# Patient Record
Sex: Female | Born: 1970 | Race: White | Hispanic: No | Marital: Married | Smoking: Never smoker
Health system: Southern US, Community
[De-identification: ages and names within clinical notes are randomized; demographics above are authoritative.]

## PROBLEM LIST (undated history)

## (undated) DIAGNOSIS — N920 Excessive and frequent menstruation with regular cycle: Secondary | ICD-10-CM

## (undated) DIAGNOSIS — E079 Disorder of thyroid, unspecified: Secondary | ICD-10-CM

## (undated) HISTORY — PX: OTHER SURGICAL HISTORY: SHX169

## (undated) HISTORY — DX: Disorder of thyroid, unspecified: E07.9

---

## 2005-06-10 ENCOUNTER — Other Ambulatory Visit: Admission: RE | Admit: 2005-06-10 | Discharge: 2005-06-10 | Payer: Self-pay | Admitting: Gynecology

## 2005-10-30 ENCOUNTER — Encounter: Admission: RE | Admit: 2005-10-30 | Discharge: 2005-10-30 | Payer: Self-pay | Admitting: Gynecology

## 2006-08-06 ENCOUNTER — Other Ambulatory Visit: Admission: RE | Admit: 2006-08-06 | Discharge: 2006-08-06 | Payer: Self-pay | Admitting: Gynecology

## 2008-09-01 ENCOUNTER — Encounter: Admission: RE | Admit: 2008-09-01 | Discharge: 2008-09-01 | Payer: Self-pay | Admitting: Gynecology

## 2009-09-19 ENCOUNTER — Encounter: Admission: RE | Admit: 2009-09-19 | Discharge: 2009-09-19 | Payer: Self-pay | Admitting: Gynecology

## 2010-09-17 ENCOUNTER — Other Ambulatory Visit: Payer: Self-pay | Admitting: Gynecology

## 2010-09-17 DIAGNOSIS — Z1231 Encounter for screening mammogram for malignant neoplasm of breast: Secondary | ICD-10-CM

## 2010-09-25 ENCOUNTER — Ambulatory Visit: Payer: Self-pay

## 2010-10-04 ENCOUNTER — Ambulatory Visit
Admission: RE | Admit: 2010-10-04 | Discharge: 2010-10-04 | Disposition: A | Discharge status: Home / Self Care | Payer: BC Managed Care – PPO | Source: Ambulatory Visit | Attending: Gynecology | Admitting: Gynecology

## 2010-10-04 DIAGNOSIS — Z1231 Encounter for screening mammogram for malignant neoplasm of breast: Secondary | ICD-10-CM

## 2011-09-26 ENCOUNTER — Other Ambulatory Visit: Payer: Self-pay | Admitting: Gynecology

## 2011-09-26 DIAGNOSIS — Z1231 Encounter for screening mammogram for malignant neoplasm of breast: Secondary | ICD-10-CM

## 2011-10-07 ENCOUNTER — Ambulatory Visit
Admission: RE | Admit: 2011-10-07 | Discharge: 2011-10-07 | Disposition: A | Discharge status: Home / Self Care | Payer: BC Managed Care – PPO | Source: Ambulatory Visit | Attending: Gynecology | Admitting: Gynecology

## 2011-10-07 DIAGNOSIS — Z1231 Encounter for screening mammogram for malignant neoplasm of breast: Secondary | ICD-10-CM

## 2011-12-13 ENCOUNTER — Encounter (HOSPITAL_BASED_OUTPATIENT_CLINIC_OR_DEPARTMENT_OTHER): Payer: Self-pay | Admitting: *Deleted

## 2011-12-13 NOTE — Progress Notes (Addendum)
NPO AFTER MN. ARRIVES AT 0615. NEEDS HG AND URINE PREG.  PT CASE HAS BEEN MOVED UP TO 6213YQ. OFFICE TO INFORM PT TO ARRIVE AT 0600.

## 2011-12-20 ENCOUNTER — Encounter (HOSPITAL_BASED_OUTPATIENT_CLINIC_OR_DEPARTMENT_OTHER)
Admission: RE | Disposition: A | Discharge status: Home / Self Care | Payer: Self-pay | Source: Ambulatory Visit | Attending: Gynecology

## 2011-12-20 ENCOUNTER — Encounter (HOSPITAL_BASED_OUTPATIENT_CLINIC_OR_DEPARTMENT_OTHER): Payer: Self-pay | Admitting: Anesthesiology

## 2011-12-20 ENCOUNTER — Encounter (HOSPITAL_BASED_OUTPATIENT_CLINIC_OR_DEPARTMENT_OTHER): Payer: Self-pay | Admitting: *Deleted

## 2011-12-20 ENCOUNTER — Ambulatory Visit (HOSPITAL_BASED_OUTPATIENT_CLINIC_OR_DEPARTMENT_OTHER)
Admission: RE | Admit: 2011-12-20 | Discharge: 2011-12-20 | Disposition: A | Discharge status: Home / Self Care | Payer: BC Managed Care – PPO | Source: Ambulatory Visit | Attending: Gynecology | Admitting: Gynecology

## 2011-12-20 ENCOUNTER — Ambulatory Visit (HOSPITAL_BASED_OUTPATIENT_CLINIC_OR_DEPARTMENT_OTHER): Payer: BC Managed Care – PPO | Admitting: Anesthesiology

## 2011-12-20 DIAGNOSIS — N920 Excessive and frequent menstruation with regular cycle: Secondary | ICD-10-CM | POA: Insufficient documentation

## 2011-12-20 DIAGNOSIS — R9389 Abnormal findings on diagnostic imaging of other specified body structures: Secondary | ICD-10-CM | POA: Insufficient documentation

## 2011-12-20 DIAGNOSIS — N840 Polyp of corpus uteri: Secondary | ICD-10-CM | POA: Insufficient documentation

## 2011-12-20 HISTORY — DX: Excessive and frequent menstruation with regular cycle: N92.0

## 2011-12-20 LAB — POCT PREGNANCY, URINE: Preg Test, Ur: NEGATIVE

## 2011-12-20 LAB — POCT HEMOGLOBIN-HEMACUE: Hemoglobin: 12.8 g/dL (ref 12.0–15.0)

## 2011-12-20 SURGERY — DILATATION & CURETTAGE/HYSTEROSCOPY WITH RESECTOCOPE
Anesthesia: Monitor Anesthesia Care | Site: Uterus

## 2011-12-20 MED ORDER — LACTATED RINGERS IV SOLN
INTRAVENOUS | Status: DC | PRN
  Administered 2011-12-20 (×2): via INTRAVENOUS

## 2011-12-20 MED ORDER — KETOROLAC TROMETHAMINE 30 MG/ML IJ SOLN
INTRAMUSCULAR | Status: DC | PRN
  Administered 2011-12-20: 30 mg via INTRAVENOUS

## 2011-12-20 MED ORDER — GLYCINE 1.5 % IR SOLN
Status: DC | PRN
  Administered 2011-12-20: 3000 mL

## 2011-12-20 MED ORDER — PROMETHAZINE HCL 25 MG/ML IJ SOLN: 6.2500 mg | INTRAMUSCULAR | Status: DC | PRN

## 2011-12-20 MED ORDER — DEXAMETHASONE SODIUM PHOSPHATE 4 MG/ML IJ SOLN
INTRAMUSCULAR | Status: DC | PRN
  Administered 2011-12-20: 4 mg via INTRAVENOUS

## 2011-12-20 MED ORDER — LACTATED RINGERS IV SOLN: INTRAVENOUS | Status: DC

## 2011-12-20 MED ORDER — MIDAZOLAM HCL 5 MG/5ML IJ SOLN
INTRAMUSCULAR | Status: DC | PRN
  Administered 2011-12-20 (×2): 1 mg via INTRAVENOUS

## 2011-12-20 MED ORDER — LACTATED RINGERS IV SOLN
INTRAVENOUS | Status: DC
  Administered 2011-12-20: 07:00:00 via INTRAVENOUS

## 2011-12-20 MED ORDER — ONDANSETRON HCL 4 MG/2ML IJ SOLN
INTRAMUSCULAR | Status: DC | PRN
  Administered 2011-12-20: 4 mg via INTRAVENOUS

## 2011-12-20 MED ORDER — LIDOCAINE HCL (PF) 1 % IJ SOLN
INTRAMUSCULAR | Status: DC | PRN
  Administered 2011-12-20: 13 mL

## 2011-12-20 MED ORDER — PROPOFOL 10 MG/ML IV EMUL
INTRAVENOUS | Status: DC | PRN
  Administered 2011-12-20: 75 ug/kg/min via INTRAVENOUS

## 2011-12-20 MED ORDER — FENTANYL CITRATE 0.05 MG/ML IJ SOLN
INTRAMUSCULAR | Status: DC | PRN
  Administered 2011-12-20: 25 ug via INTRAVENOUS
  Administered 2011-12-20: 50 ug via INTRAVENOUS
  Administered 2011-12-20 (×5): 25 ug via INTRAVENOUS

## 2011-12-20 MED ORDER — PROPOFOL 10 MG/ML IV EMUL
INTRAVENOUS | Status: DC | PRN
  Administered 2011-12-20: 30 mg via INTRAVENOUS

## 2011-12-20 MED ORDER — FENTANYL CITRATE 0.05 MG/ML IJ SOLN: 25.0000 ug | INTRAMUSCULAR | Status: DC | PRN

## 2011-12-20 SURGICAL SUPPLY — 27 items
CANISTER SUCTION 2500CC (MISCELLANEOUS) ×2 IMPLANT
CATH ROBINSON RED A/P 16FR (CATHETERS) ×2 IMPLANT
CLOTH BEACON ORANGE TIMEOUT ST (SAFETY) ×2 IMPLANT
CORD ACTIVE DISPOSABLE (ELECTRODE) ×1
CORD ELECTRO ACTIVE DISP (ELECTRODE) ×1 IMPLANT
COVER TABLE BACK 60X90 (DRAPES) ×2 IMPLANT
DRAPE CAMERA CLOSED 9X96 (DRAPES) ×2 IMPLANT
DRAPE LG THREE QUARTER DISP (DRAPES) ×2 IMPLANT
ELECT LOOP GYNE PRO 24FR (CUTTING LOOP) ×2
ELECT REM PT RETURN 9FT ADLT (ELECTROSURGICAL) ×2
ELECT VAPORTRODE GRVD BAR (ELECTRODE) ×2 IMPLANT
ELECTRODE LOOP GYNE PRO 24FR (CUTTING LOOP) ×1 IMPLANT
ELECTRODE REM PT RTRN 9FT ADLT (ELECTROSURGICAL) ×1 IMPLANT
GLOVE ECLIPSE 7.0 STRL STRAW (GLOVE) ×2 IMPLANT
GLOVE ECLIPSE 8.0 STRL XLNG CF (GLOVE) ×4 IMPLANT
GLYCINE 1.5% IRRIG UROMATIC (IV SOLUTION) ×2 IMPLANT
GOWN STRL NON-REIN LRG LVL3 (GOWN DISPOSABLE) ×2 IMPLANT
GOWN STRL REIN XL XLG (GOWN DISPOSABLE) ×2 IMPLANT
LEGGING LITHOTOMY PAIR STRL (DRAPES) ×2 IMPLANT
PACK BASIN DAY SURGERY FS (CUSTOM PROCEDURE TRAY) ×2 IMPLANT
PAD OB MATERNITY 4.3X12.25 (PERSONAL CARE ITEMS) ×2 IMPLANT
PAD PREP 24X48 CUFFED NSTRL (MISCELLANEOUS) ×2 IMPLANT
SYR CONTROL 10ML LL (SYRINGE) ×2 IMPLANT
TOWEL OR 17X24 6PK STRL BLUE (TOWEL DISPOSABLE) ×2 IMPLANT
TRAY DSU PREP LF (CUSTOM PROCEDURE TRAY) ×2 IMPLANT
TUBING HYDROFLEX HYSTEROSCOPY (TUBING) ×2 IMPLANT
WATER STERILE IRR 500ML POUR (IV SOLUTION) ×2 IMPLANT

## 2011-12-20 NOTE — Discharge Instructions (Addendum)
   [facility] Home care Instructions:   Personal hygiene:  Used sanitary napkins for vaginal drainage not tampons. Shower or tub bathe the day after your procedure. No douching until bleeding stops. Always wipe from front to back after  Elimination.  Activity: Do not drive or operate any equipment today. The effects of the anesthesia are still present and drowsiness may result. Rest today, not necessarily flat bed rest, just take it easy. You may resume your normal activity in one to 2 days.  Sexual activity: No intercourse for one week or as indicated by your physician  Diet: Eat a light diet as desired this evening. You may resume a regular diet tomorrow.  Return to work: One to 2 days.  General Expectations of your surgery: Vaginal bleeding should be no heavier than a normal period. Spotting may continue up to 10 days. Mild cramps may continue for a couple of days. You may have a regular period in 2-6 weeks.  Unexpected observations call your doctor if these occur: persistent or heavy bleeding. Severe abdominal cramping or pain. Elevation of temperature greater than 100F.  Call for an appointment in one week.    Patient's Signature_______________________________________________________  Nurse's Signature________________________________________________________   [facility] Home care Instructions:   Personal hygiene:  Used sanitary napkins for vaginal drainage not tampons. Shower or tub bathe the day after your procedure. No douching until bleeding stops. Always wipe from front to back after  Elimination.  Activity: Do not drive or operate any equipment today. The effects of the anesthesia are still present and drowsiness may result. Rest today, not necessarily flat bed rest, just take it easy. You may resume your normal activity in one to 2 days.  Sexual activity: No intercourse for one week or as indicated by your physician  Diet: Eat a light diet as desired this evening. You may resume  a regular diet tomorrow.  Return to work: One to 2 days.  General Expectations of your surgery: Vaginal bleeding should be no heavier than a normal period. Spotting may continue up to 10 days. Mild cramps may continue for a couple of days. You may have a regular period in 2-6 weeks.  Unexpected observations call your doctor if these occur: persistent or heavy bleeding. Severe abdominal cramping or pain. Elevation of temperature greater than 100F.  Call for an appointment in one week.    Patient's Signature_______________________________________________________  Nurse's Signature________________________________________________________

## 2011-12-20 NOTE — Anesthesia Procedure Notes (Addendum)
Performed by: Jessica Priest   Procedure Name: MAC Performed by: Jessica Priest Pre-anesthesia Checklist: Patient identified, Emergency Drugs available, Suction available, Patient being monitored and Timeout performed Patient Re-evaluated:Patient Re-evaluated prior to inductionOxygen Delivery Method: Simple face mask Preoxygenation: Pre-oxygenation with 100% oxygen

## 2011-12-20 NOTE — Anesthesia Preprocedure Evaluation (Signed)
Anesthesia Evaluation  Patient identified by MRN, date of birth, ID band Patient awake    Reviewed: Allergy & Precautions, H&P , NPO status , Patient's Chart, lab work & pertinent test results  Airway Mallampati: I TM Distance: >3 FB Neck ROM: Full    Dental  (+) Teeth Intact and Dental Advisory Given   Pulmonary neg pulmonary ROS,  breath sounds clear to auscultation  Pulmonary exam normal       Cardiovascular negative cardio ROS  Rhythm:Regular Rate:Normal     Neuro/Psych negative neurological ROS  negative psych ROS   GI/Hepatic negative GI ROS, Neg liver ROS,   Endo/Other  negative endocrine ROS  Renal/GU negative Renal ROS  negative genitourinary   Musculoskeletal negative musculoskeletal ROS (+)   Abdominal   Peds  Hematology negative hematology ROS (+)   Anesthesia Other Findings   Reproductive/Obstetrics negative OB ROS                           Anesthesia Physical Anesthesia Plan  ASA: I  Anesthesia Plan: MAC   Post-op Pain Management:    Induction: Intravenous  Airway Management Planned: Simple Face Mask  Additional Equipment:   Intra-op Plan:   Post-operative Plan:   Informed Consent: I have reviewed the patients History and Physical, chart, labs and discussed the procedure including the risks, benefits and alternatives for the proposed anesthesia with the patient or authorized representative who has indicated his/her understanding and acceptance.   Dental advisory given  Plan Discussed with: CRNA  Anesthesia Plan Comments:         Anesthesia Quick Evaluation

## 2011-12-20 NOTE — Transfer of Care (Signed)
Immediate Anesthesia Transfer of Care Note  Patient: Rebekah Freeman  Procedure(s) Performed: Procedure(s) (LRB): DILATATION & CURETTAGE/HYSTEROSCOPY WITH RESECTOCOPE (N/A)  Patient Location: PACU  Anesthesia Type: MAC  Level of Consciousness: awake, sedated, patient cooperative and responds to stimulation  Airway & Oxygen Therapy: Patient Spontanous Breathing and Patient connected to face mask oxygen  Post-op Assessment: Report given to PACU RN, Post -op Vital signs reviewed and stable and Patient moving all extremities  Post vital signs: Reviewed and stable  Complications: No apparent anesthesia complications

## 2011-12-20 NOTE — Anesthesia Postprocedure Evaluation (Signed)
Anesthesia Post Note  Patient: DALEYSA KRISTIANSEN  Procedure(s) Performed: Procedure(s) (LRB): DILATATION & CURETTAGE/HYSTEROSCOPY WITH RESECTOCOPE (N/A)  Anesthesia type: General  Patient location: PACU  Post pain: Pain level controlled  Post assessment: Post-op Vital signs reviewed  Last Vitals:  Filed Vitals:   12/20/11 0805  BP: 109/73  Pulse:   Temp: 36.3 C  Resp: 12    Post vital signs: Reviewed  Level of consciousness: sedated  Complications: No apparent anesthesia complications

## 2011-12-23 NOTE — Op Note (Signed)
NAMEMERISA, Rebekah Freeman                 ACCOUNT NO.:  [id number]  MEDICAL RECORD NO.:  [id number]  LOCATION:                               FACILITY:  [facility]  PHYSICIAN:  Gretta Cool, M.D. DATE OF BIRTH:  March 12, 1971  DATE OF PROCEDURE:  12/20/2011 DATE OF DISCHARGE:                              OPERATIVE REPORT   PREOPERATIVE DIAGNOSIS:  Severe menorrhagia with bleeding, coming through her clothing.  Ultrasound with thickened endometrium anterior uterine wall.  POSTOPERATIVE DIAGNOSIS:  __________ anterior uterine wall.  PROCEDURE:  Hysteroscopy, resection of anterior uterine wall polyps, and total endometrial resection for ablation Vaportrode ablation.  DESCRIPTION OF PROCEDURE:  Under excellent IV sedation and paracervical block anesthesia.  With the patient prepped and draped in lithotomy position, Allen stirrups with her bladder drained, a weighted speculum was placed in the vagina and the cervix grasped with single-tooth tenaculum.  She was then progressively dilated with series of Pratt dilators to accommodate 7-mm resectoscope.  The endometrial cavity was then photographed.  Polyps were noted on the anterior wall with apex of the fundus as seen previously by ultrasound.  Those were resected first down in to the myometrium, 5 mm or more.  Resection was ceased when there was no further gland openings.  At this point, the entire endometrial cavity was resected by 90-degree resectoscope loop.  The cornual layers were resected as well and then treated by touch techniques with Vaportrode so as to eliminate any superficial endometrium still viable and the myometrium.  The entire cavity was treated with Vaportrode so as to eliminate any adenomyosis, superficial in nature.  At this point, the procedure was terminated without complications.  Fluid deficit 90 cc.  There was no significant bleeding or reduced pressure.  Toradol 30 mg was given IV intraoperatively for postoperative pain  management.          ______________________________ Gretta Cool, M.D.     CWL/MEDQ  D:  12/20/2011  T:  12/21/2011  Job:  [id number]  cc:   Daryl Eastern, M.D. Fax: [phone]

## 2013-01-18 ENCOUNTER — Other Ambulatory Visit: Payer: Self-pay

## 2013-01-18 DIAGNOSIS — Z1231 Encounter for screening mammogram for malignant neoplasm of breast: Secondary | ICD-10-CM

## 2013-02-04 ENCOUNTER — Ambulatory Visit: Payer: BC Managed Care – PPO

## 2013-02-18 ENCOUNTER — Ambulatory Visit
Admission: RE | Admit: 2013-02-18 | Discharge: 2013-02-18 | Disposition: A | Discharge status: Home / Self Care | Payer: BC Managed Care – PPO | Source: Ambulatory Visit

## 2013-02-18 DIAGNOSIS — Z1231 Encounter for screening mammogram for malignant neoplasm of breast: Secondary | ICD-10-CM

## 2013-09-28 ENCOUNTER — Encounter: Payer: Self-pay | Admitting: Podiatry

## 2013-09-28 ENCOUNTER — Telehealth: Payer: Self-pay | Admitting: *Deleted

## 2013-09-28 ENCOUNTER — Ambulatory Visit (INDEPENDENT_AMBULATORY_CARE_PROVIDER_SITE_OTHER): Payer: BC Managed Care – PPO | Admitting: Podiatry

## 2013-09-28 VITALS — BP 110/69 | HR 64 | Resp 14 | Ht 61.0 in | Wt 121.0 lb

## 2013-09-28 DIAGNOSIS — L608 Other nail disorders: Secondary | ICD-10-CM

## 2013-09-28 NOTE — Progress Notes (Signed)
   Subjective:    Patient ID: Rebekah Freeman, female    DOB: 10/19/1970, 43 y.o.   MRN: [id number] Pt complains of rash, that began 1 1/2 month ago as a blood blister, History of similar rash last summer.  Epsom salt soaks for this episode, and Lotrimin and white vinegar soaks, and Lamisil creams to treat last years episode. HPI    Review of Systems  All other systems reviewed and are negative.       Objective:   Physical Exam: I have reviewed her past history medications allergies surgeries and social history. Pulses are strongly palpable bilateral neurologic sensorium is intact muscle strength appears to be intact bilateral. Orthopedic evaluation does demonstrate some mild hammertoe deformities very flexible in nature at this point in time cutaneous evaluation demonstrates supple well hydrated cutis she does have some area of tinea pedis left foot and some early superficial white onychomycosis lesser digits of the left foot.        Assessment & Plan:  Assessment: History of tinea pedis and nail dystrophy.  Plan: Started her on LUZU samples today and took samples of her nails and skin and send for pathology and culture. I will followup with her once those come in.

## 2013-09-28 NOTE — Telephone Encounter (Signed)
[facility]

## 2013-11-03 ENCOUNTER — Telehealth: Payer: Self-pay | Admitting: *Deleted

## 2013-11-03 NOTE — Telephone Encounter (Signed)
Per Dr. Al CorpusHyatt, I called and left a message for the patient to call and schedule an appointment for culture results.

## 2013-11-04 ENCOUNTER — Encounter: Payer: Self-pay | Admitting: Podiatry

## 2013-11-09 ENCOUNTER — Telehealth: Payer: Self-pay | Admitting: *Deleted

## 2013-11-09 ENCOUNTER — Ambulatory Visit: Payer: BC Managed Care – PPO | Admitting: Podiatry

## 2013-11-09 NOTE — Telephone Encounter (Signed)
Appointment got moved back a month.  I was getting results.  I'm concerned about what's going on.  Is it okay to wait?  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  I returned her call.  She stated she would have to take time off from work and get a substitute so she can't see him for a month.  She's supposed to get fungal culture results. I informed her no, it's not anything drastic that can happen from waiting.  She stated she just wanted to make sure.  I told her he would give her the results and give her recommended treatment for the fungus.  She stated she'll see him in a month then, can't come in sooner.

## 2013-11-30 ENCOUNTER — Ambulatory Visit (INDEPENDENT_AMBULATORY_CARE_PROVIDER_SITE_OTHER): Payer: BC Managed Care – PPO | Admitting: Podiatry

## 2013-11-30 ENCOUNTER — Encounter: Payer: Self-pay | Admitting: Podiatry

## 2013-11-30 VITALS — BP 113/69 | HR 57 | Resp 16

## 2013-11-30 DIAGNOSIS — Z79899 Other long term (current) drug therapy: Secondary | ICD-10-CM

## 2013-11-30 MED ORDER — TERBINAFINE HCL 250 MG PO TABS: 250.0000 mg | ORAL_TABLET | Freq: Every day | ORAL | Status: DC

## 2013-11-30 NOTE — Patient Instructions (Signed)

## 2013-11-30 NOTE — Progress Notes (Signed)
She presents today for followup of her pathology report which did come back positive for fungus.  Objective: Onychomycosis and tinea pedis bilateral.  Assessment: Onychomycosis and tinea pedis bilateral.  Plan: Dispensed a prescription for Lamisil 250 mg #30 one by mouth daily. Also dispensed a prescription and requisition form for blood draw consisting of a CBC and liver profile will followup with her if there are any abnormal findings. Otherwise I will followup with her in one month

## 2013-12-03 LAB — HEPATIC FUNCTION PANEL
ALBUMIN: 4.1 g/dL (ref 3.5–5.2)
ALK PHOS: 40 U/L (ref 39–117)
ALT: <8 U/L (ref 0–35)
AST: 19 U/L (ref 0–37)
BILIRUBIN INDIRECT: 1.1 mg/dL (ref 0.2–1.2)
BILIRUBIN TOTAL: 1.3 mg/dL — AB (ref 0.2–1.2)
Bilirubin, Direct: 0.2 mg/dL (ref 0.0–0.3)
TOTAL PROTEIN: 6.3 g/dL (ref 6.0–8.3)

## 2013-12-08 ENCOUNTER — Telehealth: Payer: Self-pay | Admitting: *Deleted

## 2013-12-08 NOTE — Telephone Encounter (Signed)
Per Dr. Al Corpus, I called and left patient a message that labwork was good, okay to continue medication.

## 2013-12-08 NOTE — Telephone Encounter (Signed)
Message copied by Enedina Finner on Wed Dec 08, 2013  2:03 PM ------      Message from: Lottie Rater E      Created: Wed Dec 08, 2013 12:24 PM                   ----- Message -----         From: Elinor Parkinson, DPM         Sent: 12/07/2013   8:00 AM           To: [employer], PMAC            Blood work is good continue medication. ------

## 2013-12-19 ENCOUNTER — Other Ambulatory Visit: Payer: Self-pay | Admitting: Podiatry

## 2013-12-20 ENCOUNTER — Telehealth: Payer: Self-pay | Admitting: *Deleted

## 2013-12-20 NOTE — Telephone Encounter (Signed)
I tried to get a prescription refill and I was told it couldn't be refilled.  I'm getting ready to go out of town.  Am I not to take it for a week or so?  My last appointment was 11/30/2013.  I have an appointment scheduled at the end of the month.  I called and informed her she'll be okay without the medication for a couple of weeks.  Dr. Al Corpus will probably give you another lab requisition when you come in for your next appointment as well as another prescription.  She stated okay, I just wanted to make sure it was okay to go without it.

## 2014-01-04 ENCOUNTER — Ambulatory Visit (INDEPENDENT_AMBULATORY_CARE_PROVIDER_SITE_OTHER): Payer: BC Managed Care – PPO | Admitting: Podiatry

## 2014-01-04 ENCOUNTER — Encounter: Payer: Self-pay | Admitting: Podiatry

## 2014-01-04 ENCOUNTER — Ambulatory Visit: Payer: BC Managed Care – PPO | Admitting: Podiatry

## 2014-01-04 VITALS — BP 110/73 | HR 57 | Resp 16

## 2014-01-04 DIAGNOSIS — Z79899 Other long term (current) drug therapy: Secondary | ICD-10-CM

## 2014-01-04 MED ORDER — TERBINAFINE HCL 250 MG PO TABS: 250.0000 mg | ORAL_TABLET | Freq: Every day | ORAL | Status: DC

## 2014-01-04 NOTE — Progress Notes (Signed)
She presents today for followup of onychomycosis and treatment of that with Lamisil. She denies fever chills nausea vomiting muscle aches pains.  Objective: Vital signs are stable she is alert and oriented x3. No change in nail plates as of yet.  Assessment is onychomycosis.  Plan: Continue Lamisil therapy x3 more months. She was dispensed a requisition for blood work a CBC and liver profile. I will followup with her in 4 months

## 2014-01-07 LAB — HEPATIC FUNCTION PANEL
ALBUMIN: 4.3 g/dL (ref 3.5–5.2)
ALK PHOS: 43 U/L (ref 39–117)
AST: 14 U/L (ref 0–37)
Bilirubin, Direct: 0.2 mg/dL (ref 0.0–0.3)
Indirect Bilirubin: 0.8 mg/dL (ref 0.2–1.2)
TOTAL PROTEIN: 6.5 g/dL (ref 6.0–8.3)
Total Bilirubin: 1 mg/dL (ref 0.2–1.2)

## 2014-01-11 ENCOUNTER — Other Ambulatory Visit: Payer: Self-pay

## 2014-01-11 DIAGNOSIS — Z1231 Encounter for screening mammogram for malignant neoplasm of breast: Secondary | ICD-10-CM

## 2014-01-17 ENCOUNTER — Telehealth: Payer: Self-pay | Admitting: *Deleted

## 2014-01-17 NOTE — Telephone Encounter (Signed)
Message copied by Enedina FinnerMEADOWS, Demaya Hardge J on Mon Jan 17, 2014 10:28 AM ------      Message from: Lottie RaterPREVETTE, ASHLEY E      Created: Mon Jan 17, 2014  8:31 AM                   ----- Message -----         From: Elinor ParkinsonMax T Hyatt, DPM         Sent: 01/10/2014   6:45 AM           To: [employer] SchoolAshley E Prevette, PMAC            Blood work is good may continue medication. ------

## 2014-01-17 NOTE — Telephone Encounter (Signed)
I called and informed her per Dr. Al CorpusHyatt that her bloodwork looks good per Dr. Al CorpusHyatt.  Can continue you medicine.  She stated oh good, thanks for calling.

## 2014-02-21 ENCOUNTER — Ambulatory Visit
Admission: RE | Admit: 2014-02-21 | Discharge: 2014-02-21 | Disposition: A | Discharge status: Home / Self Care | Payer: BC Managed Care – PPO | Source: Ambulatory Visit

## 2014-02-21 DIAGNOSIS — Z1231 Encounter for screening mammogram for malignant neoplasm of breast: Secondary | ICD-10-CM

## 2014-04-02 ENCOUNTER — Other Ambulatory Visit: Payer: Self-pay | Admitting: Podiatry

## 2014-05-10 ENCOUNTER — Encounter: Payer: Self-pay | Admitting: Podiatry

## 2014-05-10 ENCOUNTER — Ambulatory Visit (INDEPENDENT_AMBULATORY_CARE_PROVIDER_SITE_OTHER): Payer: BC Managed Care – PPO | Admitting: Podiatry

## 2014-05-10 VITALS — BP 119/67 | HR 66 | Resp 16

## 2014-05-10 DIAGNOSIS — Z79899 Other long term (current) drug therapy: Secondary | ICD-10-CM

## 2014-05-10 MED ORDER — TERBINAFINE HCL 250 MG PO TABS: 250.0000 mg | ORAL_TABLET | Freq: Every day | ORAL | Status: AC

## 2014-05-10 NOTE — Progress Notes (Signed)
She presents today for follow-up of her Lamisil therapy for onychomycosis.  Objective: Vital signs are stable she is alert and oriented 3. Pulses are strongly palpable bilateral. She has approximate 75% clearance of her worse nail hallux left.  Assessment: Well-healing onychomycosis secondary Lamisil therapy.  Plan: Discussed etiology pathology concerned versus surgical therapies I went ahead and continue her Lamisil for another 30 days and I will follow-up with her in 3 months.

## 2014-08-11 ENCOUNTER — Ambulatory Visit: Payer: BC Managed Care – PPO | Admitting: Podiatry

## 2014-08-18 ENCOUNTER — Ambulatory Visit: Payer: BC Managed Care – PPO | Admitting: Podiatry

## 2015-07-04 ENCOUNTER — Other Ambulatory Visit: Payer: Self-pay | Admitting: Family Medicine

## 2015-07-04 ENCOUNTER — Ambulatory Visit
Admission: RE | Admit: 2015-07-04 | Discharge: 2015-07-04 | Disposition: A | Discharge status: Home / Self Care | Payer: BC Managed Care – PPO | Source: Ambulatory Visit | Attending: Family Medicine | Admitting: Family Medicine

## 2015-07-04 DIAGNOSIS — M542 Cervicalgia: Secondary | ICD-10-CM

## 2015-07-04 DIAGNOSIS — M79601 Pain in right arm: Secondary | ICD-10-CM

## 2016-11-24 IMAGING — CR DG CERVICAL SPINE COMPLETE 4+V
6 series · 6 of 6 positions shown · non-contrast
Comparison: None.

CLINICAL DATA: Right scapular pain. No known injury. Right neck
pain.

EXAM:
CERVICAL SPINE - COMPLETE 4+ VIEW

[w cervical spine ap]
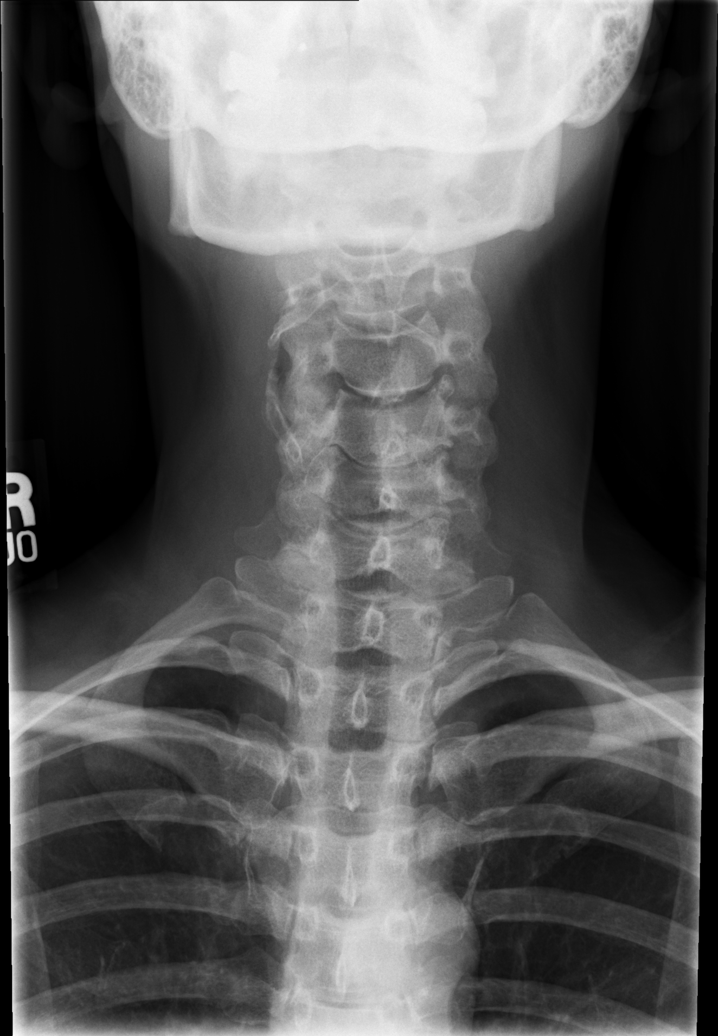

[w cervical spine ap_obl (1 of 2)]
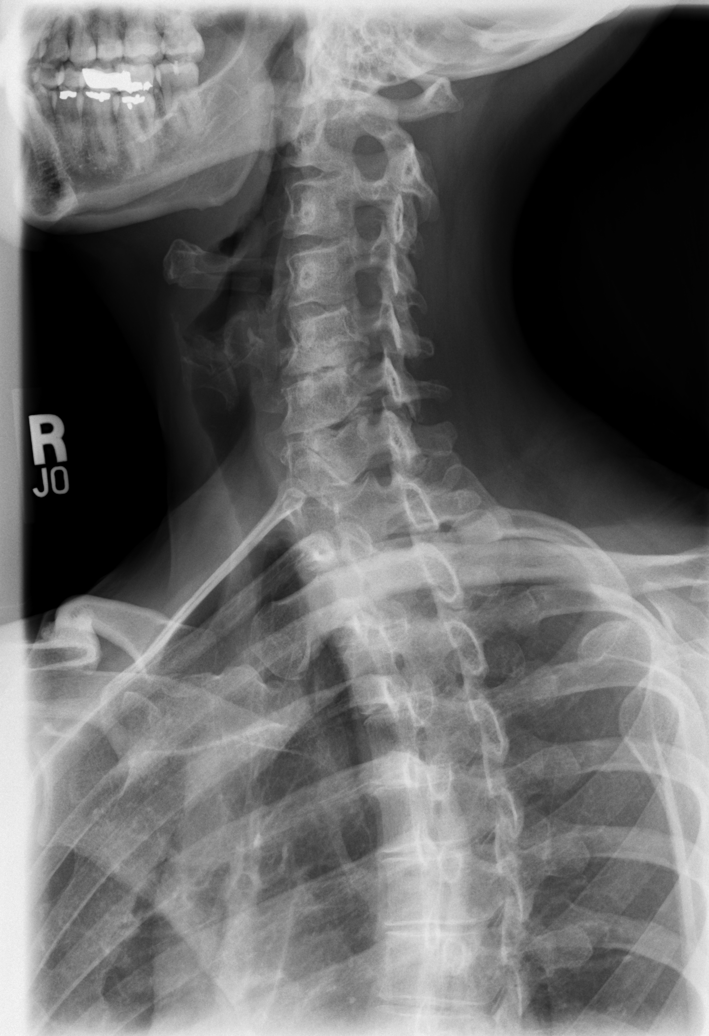

[w cervical spine ap_obl (2 of 2)]
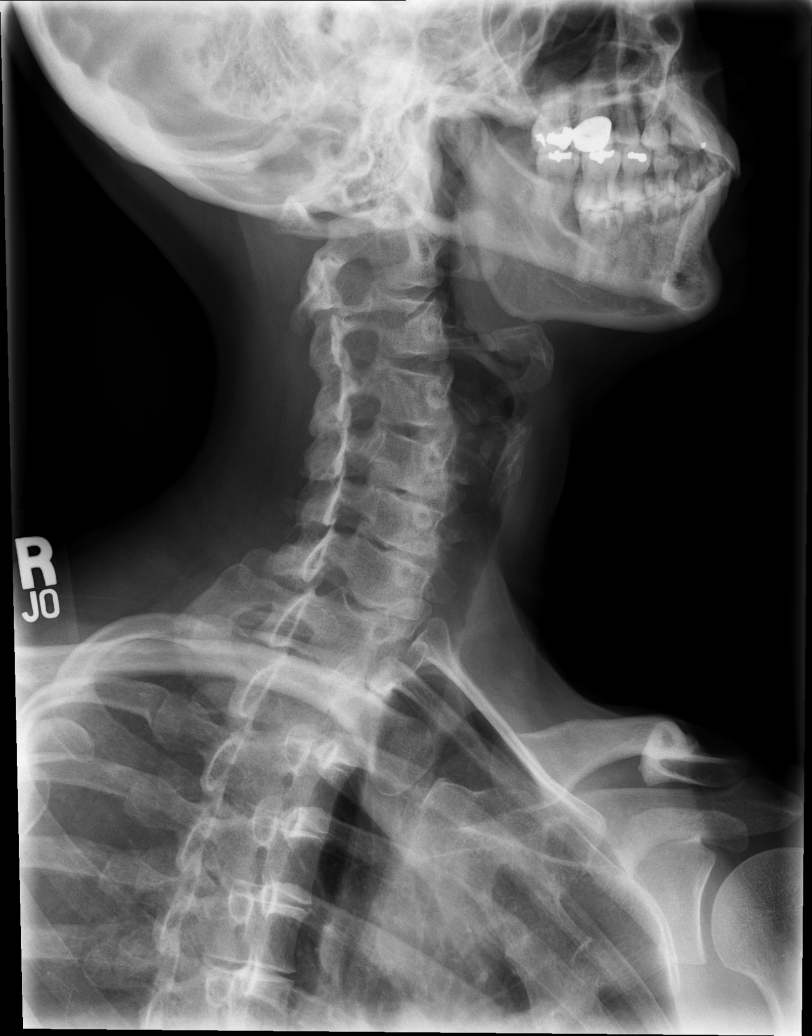

[w cervical spine lat]
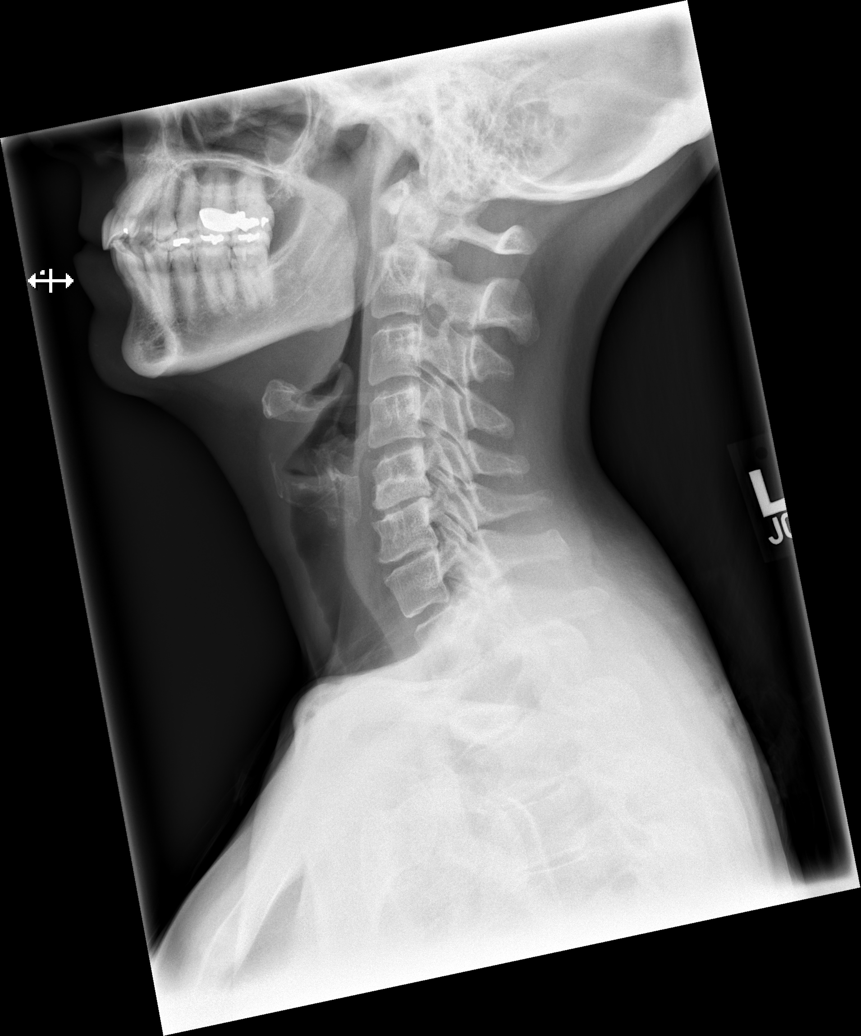

[w cervical spine odontoid (1 of 2)]
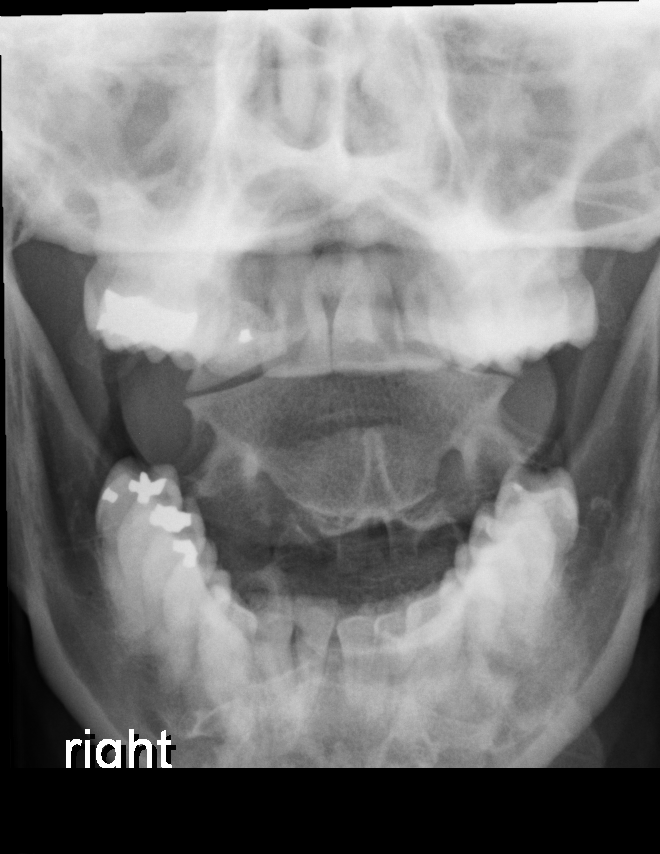

[w cervical spine odontoid (2 of 2)]
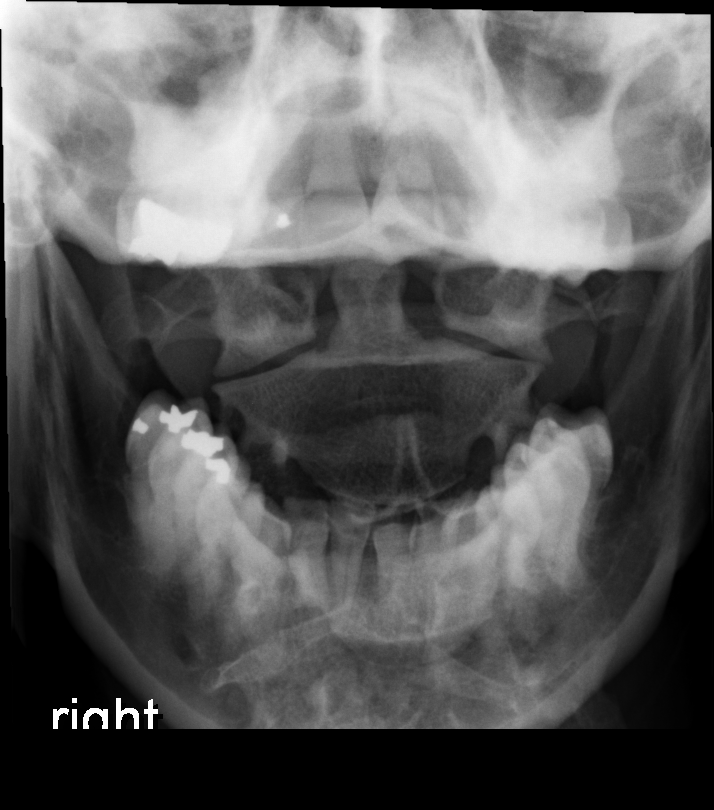

[6 of 6 positions shown; findings below may reference images not displayed]

FINDINGS: Normal alignment. Degenerative disc disease at C5-6 and C6-7.
Uncovertebral spurring causes mild left neural foraminal narrowing
at C5-6 and C6-7. No right neural foraminal narrowing. No fracture.
Prevertebral soft tissues are normal.
IMPRESSION: Spondylosis as above.  No acute bony abnormality.

## 2016-11-24 IMAGING — CR DG SHOULDER 2+V*R*
3 series · 3 of 3 positions shown · non-contrast
Comparison: None.

CLINICAL DATA: Right scapular pain.  No injury.

EXAM:
RIGHT SHOULDER - 2+ VIEW

[x shoulder axillary right]
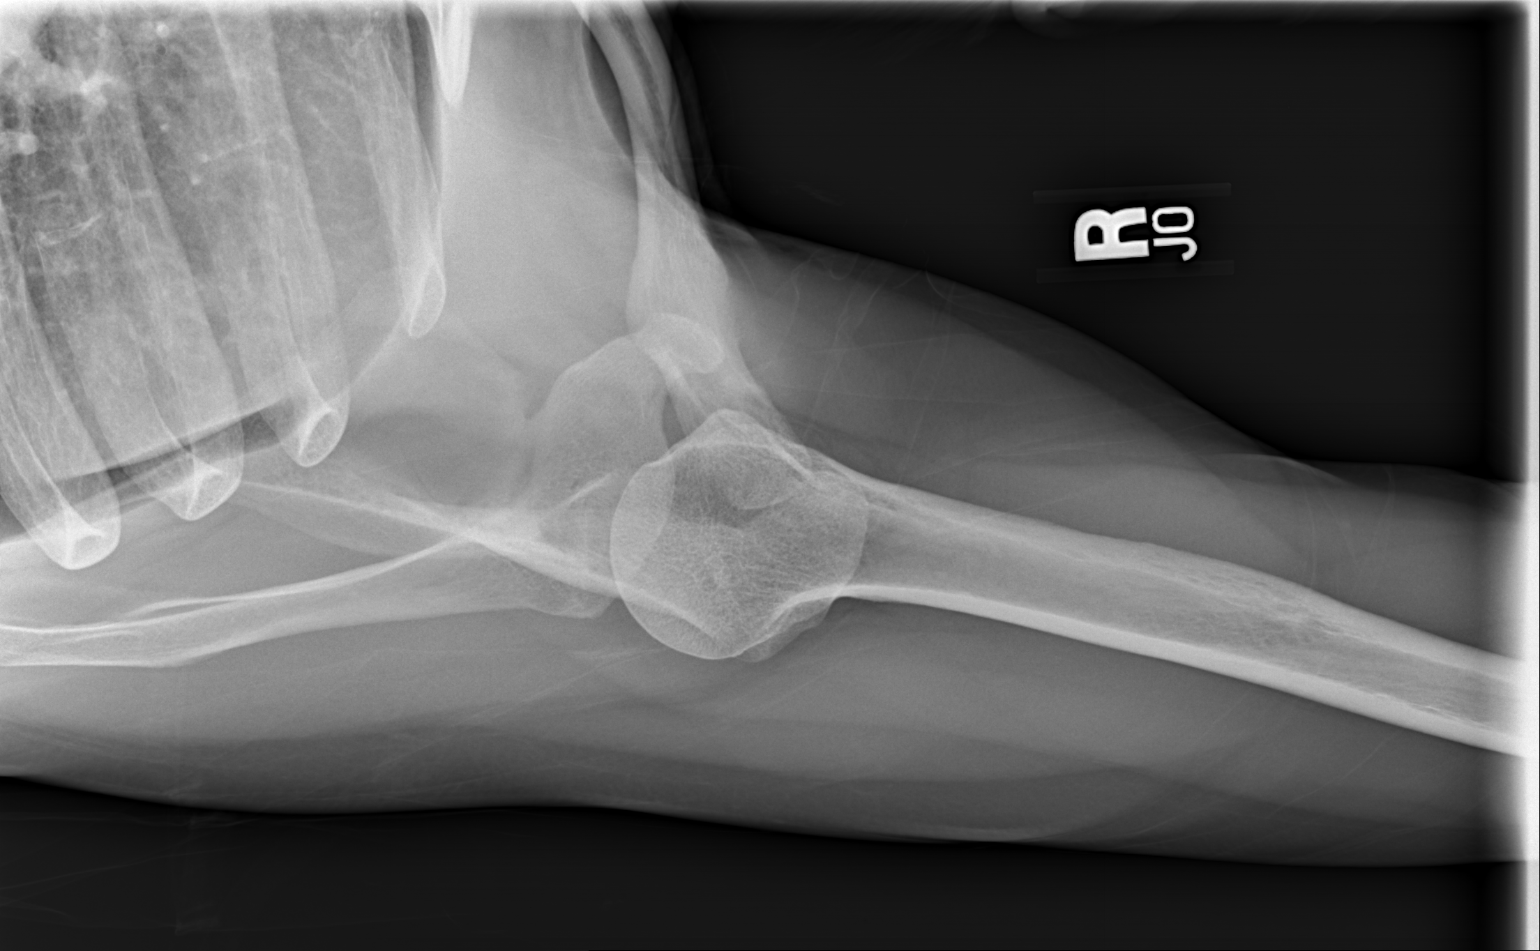

[w shoulder grashey right]
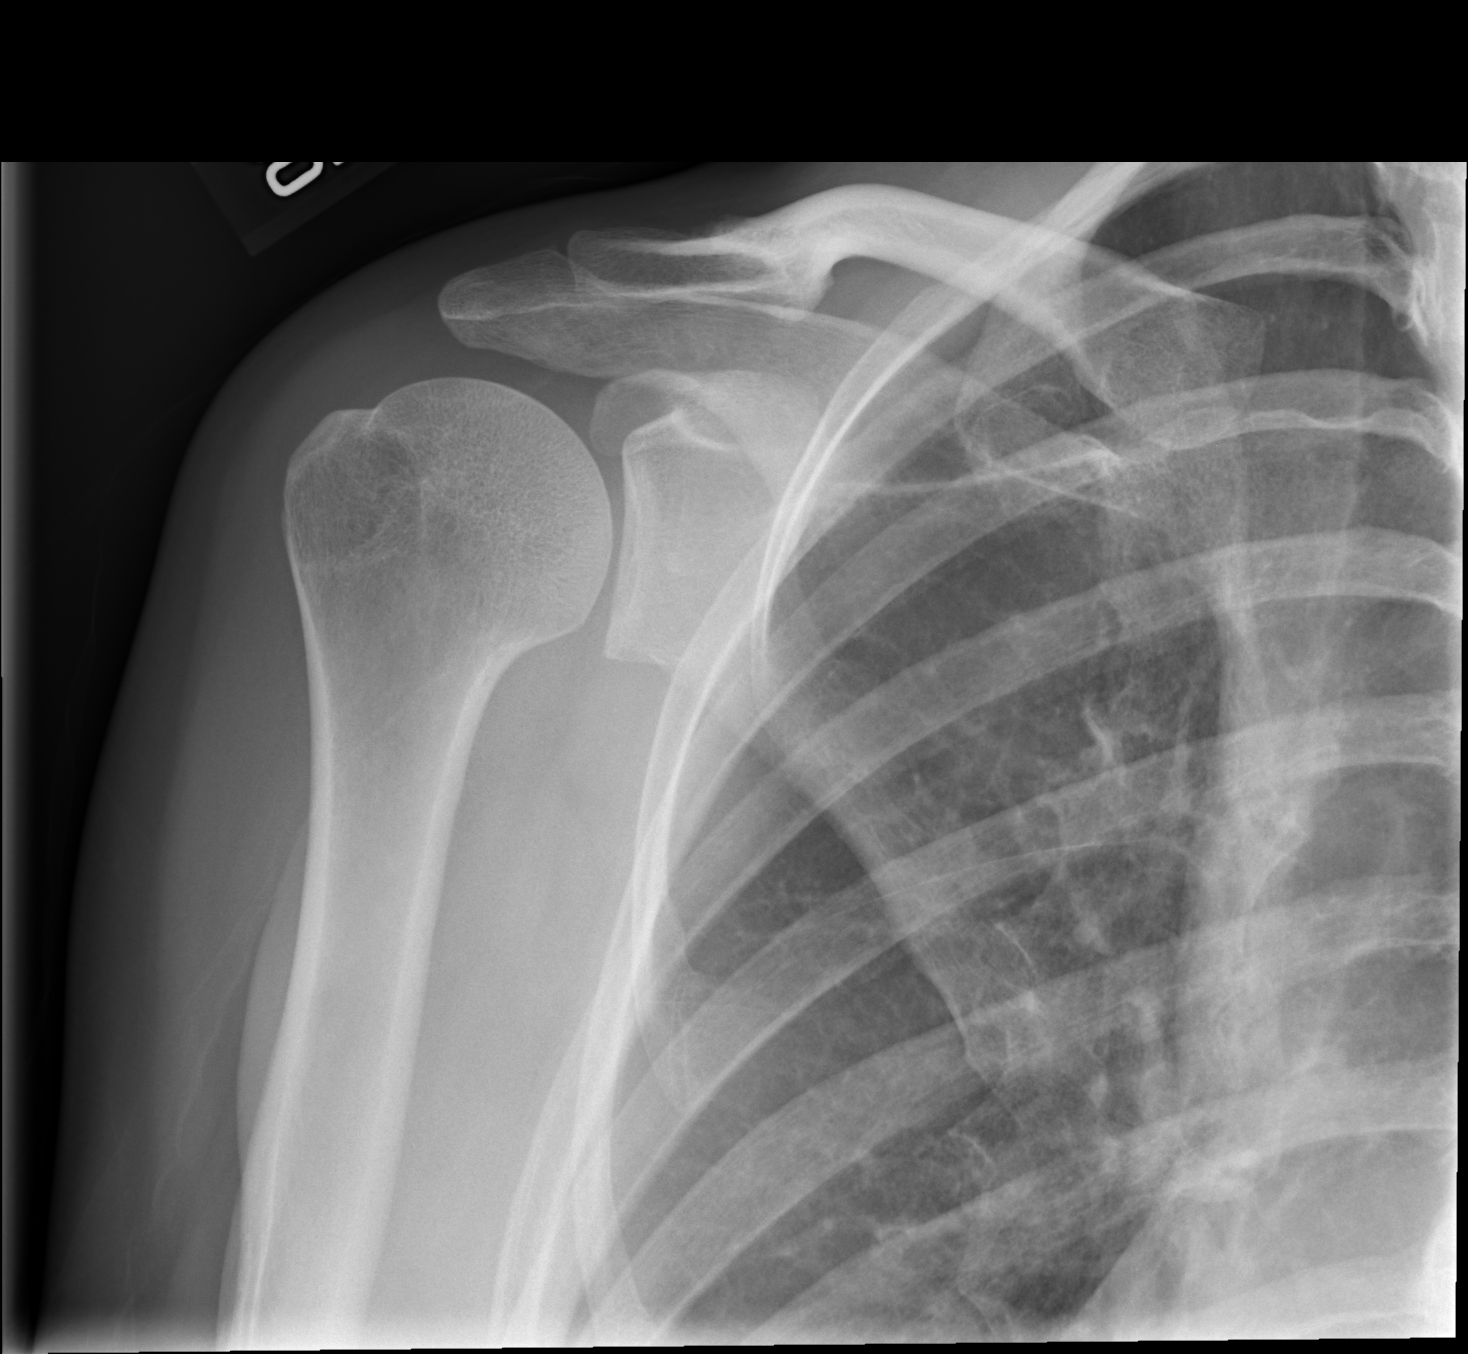

[w shoulder y-view right]
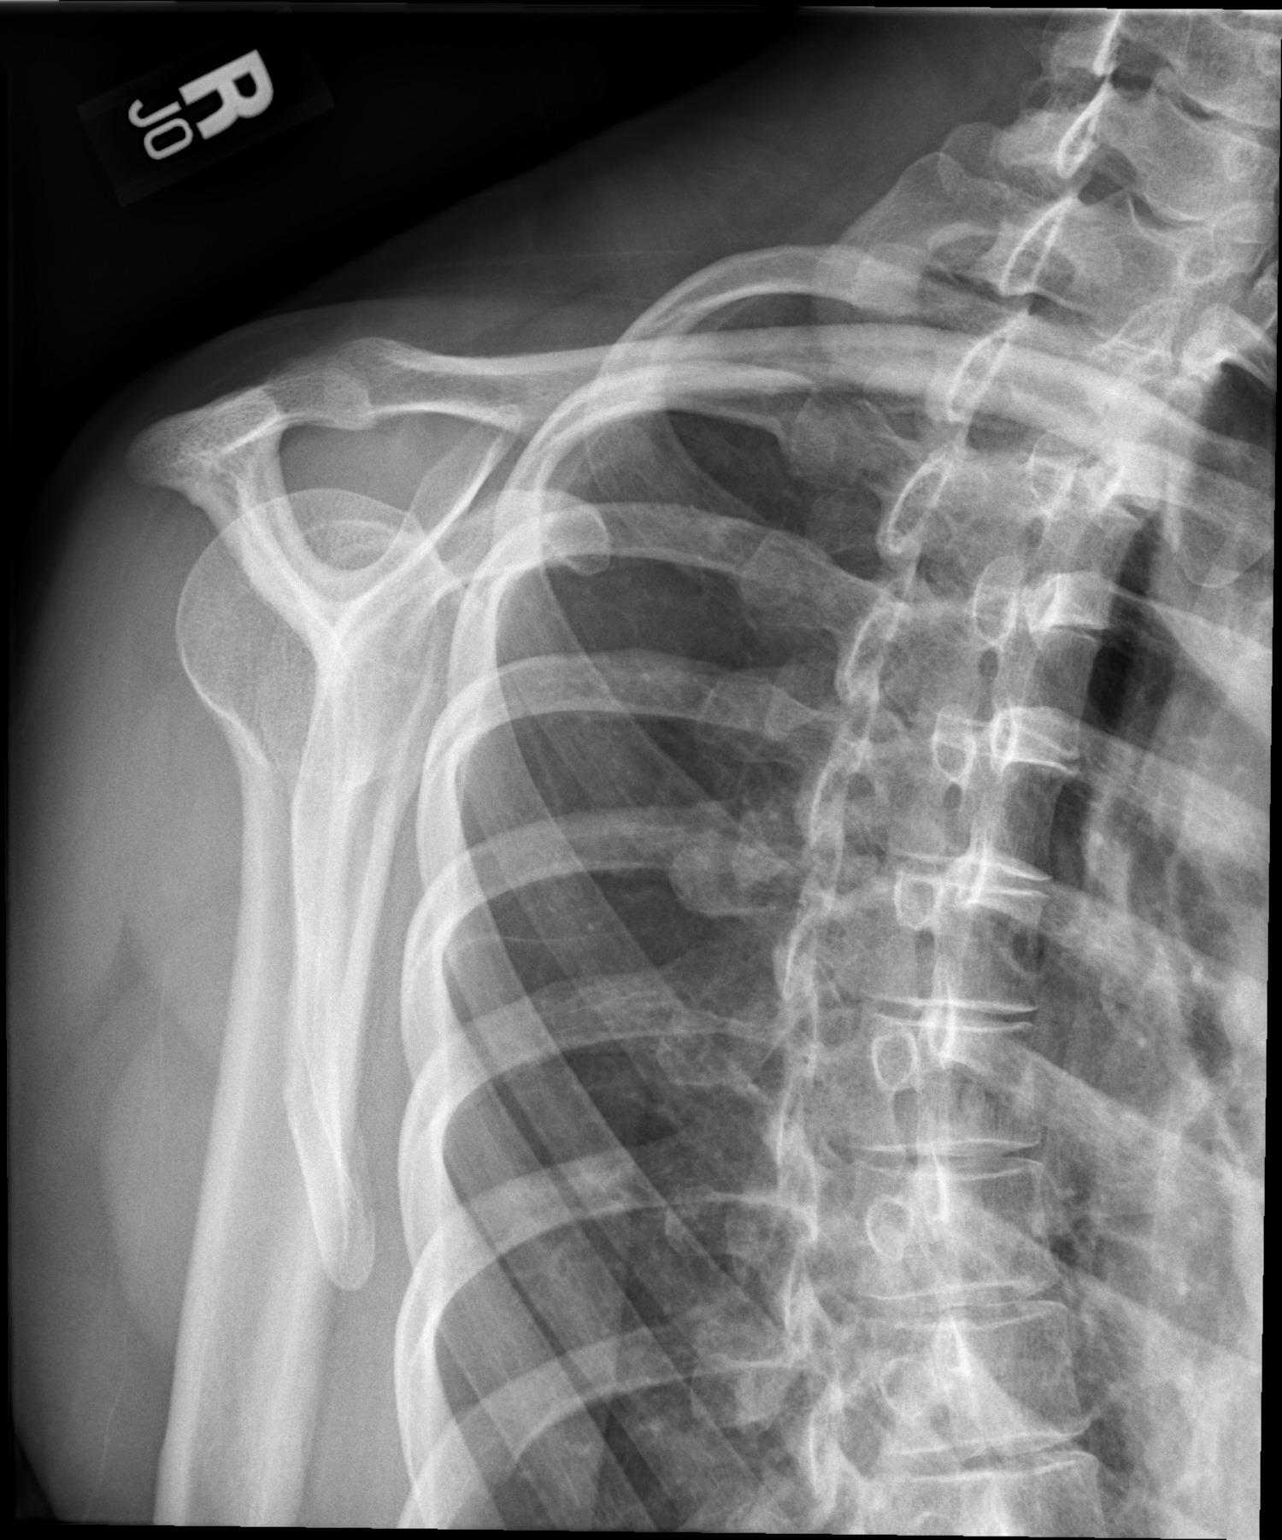

[3 of 3 positions shown; findings below may reference images not displayed]

FINDINGS: Right shoulder is located without a fracture. No gross abnormality
to the right scapula. The visualized right ribs are intact.
IMPRESSION: No acute abnormality to the right shoulder.

## 2021-04-24 ENCOUNTER — Other Ambulatory Visit: Payer: Self-pay | Admitting: Oral Surgery

## 2022-02-11 ENCOUNTER — Other Ambulatory Visit (HOSPITAL_BASED_OUTPATIENT_CLINIC_OR_DEPARTMENT_OTHER): Payer: Self-pay | Admitting: Family Medicine

## 2022-02-11 ENCOUNTER — Other Ambulatory Visit: Payer: Self-pay | Admitting: Family Medicine

## 2022-02-11 DIAGNOSIS — Z8249 Family history of ischemic heart disease and other diseases of the circulatory system: Secondary | ICD-10-CM

## 2022-02-27 ENCOUNTER — Other Ambulatory Visit (HOSPITAL_BASED_OUTPATIENT_CLINIC_OR_DEPARTMENT_OTHER): Payer: BC Managed Care – PPO

## 2022-02-27 ENCOUNTER — Encounter (HOSPITAL_BASED_OUTPATIENT_CLINIC_OR_DEPARTMENT_OTHER): Payer: Self-pay

## 2022-03-08 ENCOUNTER — Ambulatory Visit (HOSPITAL_BASED_OUTPATIENT_CLINIC_OR_DEPARTMENT_OTHER)
Admission: RE | Admit: 2022-03-08 | Discharge: 2022-03-08 | Disposition: A | Discharge status: Home / Self Care | Payer: Self-pay | Source: Ambulatory Visit | Attending: Family Medicine | Admitting: Family Medicine

## 2022-03-08 DIAGNOSIS — Z8249 Family history of ischemic heart disease and other diseases of the circulatory system: Secondary | ICD-10-CM | POA: Insufficient documentation
